# Patient Record
Sex: Male | Born: 1985 | Race: Black or African American | Hispanic: No | Marital: Single | Smoking: Current every day smoker
Health system: Southern US, Community
[De-identification: ages and names within clinical notes are randomized; demographics above are authoritative.]

## PROBLEM LIST (undated history)

## (undated) DIAGNOSIS — H16133 Photokeratitis, bilateral: Secondary | ICD-10-CM

## (undated) DIAGNOSIS — G43909 Migraine, unspecified, not intractable, without status migrainosus: Secondary | ICD-10-CM

## (undated) DIAGNOSIS — G8929 Other chronic pain: Secondary | ICD-10-CM

## (undated) DIAGNOSIS — M549 Dorsalgia, unspecified: Secondary | ICD-10-CM

---

## 2011-12-18 ENCOUNTER — Emergency Department (HOSPITAL_COMMUNITY): Payer: Self-pay

## 2011-12-18 ENCOUNTER — Emergency Department (HOSPITAL_COMMUNITY)
Admission: EM | Admit: 2011-12-18 | Discharge: 2011-12-19 | Disposition: A | Discharge status: Home / Self Care | Payer: Self-pay | Attending: Emergency Medicine | Admitting: Emergency Medicine

## 2011-12-18 ENCOUNTER — Encounter (HOSPITAL_COMMUNITY): Payer: Self-pay | Admitting: Emergency Medicine

## 2011-12-18 DIAGNOSIS — Z87828 Personal history of other (healed) physical injury and trauma: Secondary | ICD-10-CM | POA: Insufficient documentation

## 2011-12-18 DIAGNOSIS — M545 Low back pain, unspecified: Secondary | ICD-10-CM | POA: Insufficient documentation

## 2011-12-18 DIAGNOSIS — R51 Headache: Secondary | ICD-10-CM | POA: Insufficient documentation

## 2011-12-18 DIAGNOSIS — G8929 Other chronic pain: Secondary | ICD-10-CM | POA: Insufficient documentation

## 2011-12-18 DIAGNOSIS — M549 Dorsalgia, unspecified: Secondary | ICD-10-CM

## 2011-12-18 DIAGNOSIS — R159 Full incontinence of feces: Secondary | ICD-10-CM | POA: Insufficient documentation

## 2011-12-18 HISTORY — DX: Dorsalgia, unspecified: M54.9

## 2011-12-18 HISTORY — DX: Other chronic pain: G89.29

## 2011-12-18 HISTORY — DX: Migraine, unspecified, not intractable, without status migrainosus: G43.909

## 2011-12-18 MED ORDER — DIAZEPAM 5 MG PO TABS
5.0000 mg | ORAL_TABLET | Freq: Once | ORAL | Status: AC
  Administered 2011-12-19: 5 mg via ORAL
  Filled 2011-12-18: qty 1

## 2011-12-18 MED ORDER — HYDROCODONE-ACETAMINOPHEN 5-325 MG PO TABS
2.0000 | ORAL_TABLET | Freq: Once | ORAL | Status: AC
  Administered 2011-12-19: 2 via ORAL
  Filled 2011-12-18: qty 2

## 2011-12-18 MED ORDER — KETOROLAC TROMETHAMINE 10 MG PO TABS
10.0000 mg | ORAL_TABLET | Freq: Once | ORAL | Status: AC
  Administered 2011-12-19: 10 mg via ORAL
  Filled 2011-12-18: qty 1
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
## 2011-12-18 NOTE — ED Notes (Signed)
Pt c/o back and neck pain.  Denies any recent trauma or strain.  With shoulder movement, shooting pains down neck to back.

## 2011-12-19 MED ORDER — DICLOFENAC SODIUM 75 MG PO TBEC: 75.0000 mg | DELAYED_RELEASE_TABLET | Freq: Two times a day (BID) | ORAL | Status: AC

## 2011-12-19 MED ORDER — BACLOFEN 10 MG PO TABS: 10.0000 mg | ORAL_TABLET | Freq: Three times a day (TID) | ORAL | Status: AC

## 2011-12-19 NOTE — ED Notes (Signed)
Results of x-rays are still pending at this time. Patient informed.

## 2011-12-21 NOTE — ED Provider Notes (Signed)
Medical screening examination/treatment/procedure(s) were performed by non-physician practitioner and as supervising physician I was immediately available for consultation/collaboration.  Nicoletta Dress. Colon Branch, MD 12/21/11 1610

## 2014-09-04 DIAGNOSIS — H16133 Photokeratitis, bilateral: Secondary | ICD-10-CM

## 2014-09-04 HISTORY — DX: Photokeratitis, bilateral: H16.133

## 2014-09-23 ENCOUNTER — Emergency Department (HOSPITAL_COMMUNITY)
Admission: EM | Admit: 2014-09-23 | Discharge: 2014-09-23 | Disposition: A | Discharge status: Home / Self Care | Payer: Self-pay | Attending: Emergency Medicine | Admitting: Emergency Medicine

## 2014-09-23 ENCOUNTER — Encounter (HOSPITAL_COMMUNITY): Payer: Self-pay | Admitting: *Deleted

## 2014-09-23 DIAGNOSIS — H16133 Photokeratitis, bilateral: Secondary | ICD-10-CM | POA: Insufficient documentation

## 2014-09-23 DIAGNOSIS — G43909 Migraine, unspecified, not intractable, without status migrainosus: Secondary | ICD-10-CM | POA: Insufficient documentation

## 2014-09-23 DIAGNOSIS — G8929 Other chronic pain: Secondary | ICD-10-CM | POA: Insufficient documentation

## 2014-09-23 DIAGNOSIS — Z720 Tobacco use: Secondary | ICD-10-CM | POA: Insufficient documentation

## 2014-09-23 MED ORDER — KETOROLAC TROMETHAMINE 0.5 % OP SOLN
1.0000 [drp] | Freq: Four times a day (QID) | OPHTHALMIC | Status: DC | PRN
  Administered 2014-09-23: 1 [drp] via OPHTHALMIC
  Filled 2014-09-23: qty 5

## 2014-09-23 MED ORDER — FLUORESCEIN SODIUM 1 MG OP STRP
1.0000 | ORAL_STRIP | Freq: Once | OPHTHALMIC | Status: AC
  Administered 2014-09-23: 1 via OPHTHALMIC
  Filled 2014-09-23: qty 1

## 2014-09-23 MED ORDER — TOBRAMYCIN 0.3 % OP SOLN
1.0000 [drp] | OPHTHALMIC | Status: DC
  Administered 2014-09-23: 1 [drp] via OPHTHALMIC
  Filled 2014-09-23: qty 5

## 2014-09-23 MED ORDER — TETRACAINE HCL 0.5 % OP SOLN
1.0000 [drp] | Freq: Once | OPHTHALMIC | Status: AC
  Administered 2014-09-23: 1 [drp] via OPHTHALMIC
  Filled 2014-09-23: qty 2

## 2014-09-23 NOTE — Discharge Instructions (Signed)
Use the ocular (ketorolac) eye drops 1 drop in each eye 4 times a day for pain. Use the tobramax eye drops 1 drop in each eye every 4 hrs while awake for 1 day after your pain is gone. If you are not improving in the next 24 hrs, call Dr Geni Bers office to get an appointment to have him recheck your eyes. He is an Ophthalmologist.    Eye Drops Use eye drops as directed. It may be easier to have someone help you put the drops in your eye. If you are alone, use the following instructions to help you.  Wash your hands before putting drops in your eyes.  Read the label and look at your medication. Check for any expiration date that may appear on the bottle or tube. Changes of color may be a warning that the medication is old or ineffective. This is especially true if the medication has become brown in color. If you have questions or concerns, call your caregiver. DROPS  Tilt your head back with the affected eye uppermost. Gently pull down on your lower lid. Do not pull up on the upper lid.  Look up. Place the dropper or bottle just over the edge of the lower lid near the white portion at the bottom of the eye. The goal is to have the drop go into the little sac formed by the lower lid and the bottom of the eye itself. Do not release the drop from a height of several inches over the eye. That will only serve to startle the person receiving the medicine when it lands and forces a blink.  Steady your hand in a comfortable manner. An example would be to hold the dropper or bottle between your thumb and index (pointing) finger. Lean your index finger against the brow.  Then, slowly and gently squeeze one drop of medication into your eye.  Once the medication has been applied, place your finger between the lower eyelid and the nose, pressing firmly against the nose for 5-10 seconds. This will slow the process of the eye drop entering the small canal that normally drains tears into the nose, and therefore  increases the exposure of the medicine to the eye for a few extra seconds. OINTMENTS  Look up. Place the tip of the tube just over the edge of the lower lid near the white portion at the bottom of the eye. The goal is to create a line of ointment along the inner surface of the eyelid in the little sac formed by the lower lid and the bottom of the eye itself.  Avoid touching the tube tip to your eyeball or eyelid. This avoids contamination of the tube or the medicine in the tube.  Once a line of medicine has been created, hold the upper lid up and look down before releasing the upper lid. This will force the ointment to spread over the surface of the eye.  Your vision will be very blurry for a few minutes after applying an ointment properly. This is normal and will clear as you continue to blink. For this reason, it is best to apply ointments just before going to sleep, or at a time when you can rest your eyes for 5-10 minutes after applying the medication. GENERAL  Store your medicine in a cool, dry place after each use.  If you need a second medication, wait at least two minutes. This helps the first medication to be taken up (absorbed) by the eye.  If you have been instructed to use both an eye drop and an eye ointment, always apply the drop first and then the ointment 3-4 minutes afterward. Never put medications into the eye unless the label reads, "For Ophthalmic Use," "For Use In Eyes" or "Eye Drops." If you have questions, call your caregiver. Document Released: 03/28/2000 Document Revised: 05/06/2013 Document Reviewed: 06/03/2008 [facility] Patient Information 2015 [place], [place]. This information is not intended to replace advice given to you by your health care provider. Make sure you discuss any questions you have with your health care provider.  Ultraviolet Keratitis Ultraviolet keratitis can occur when too much UV light enters the cornea. The cornea is the clear cover on the front  part of your eye that helps focus light. It protects your eyes from dust and other foreign bodies and filters ultraviolet rays. This condition can be caused by exposure to snow (snow blindness) from the reflected or direct sunlight. It may also be caused by exposure to welding arcs or halogen lamps (flash burn) and prolonged exposure to direct sunlight. Brief exposure can result in severe problems several hours later. CAUSES  Causes of ultraviolet keratitis include:  Exposure to snow (snow blindness) from the reflected or direct sunlight.  Exposure to welding arcs or halogen lamps (flash burn).  Prolonged exposure to direct sunlight. SYMPTOMS  The symptoms of ultraviolet keratitis usually start 6 to 12 hours after the damage occurred. They may include the following:   Tearing.  Light sensitivity.  Gritty sensation in eyes.  Swelling of your eyelids.  Severe pain. In spite of the pain, this condition will usually improve within 24 hours even without treatment. HOME CARE INSTRUCTIONS   Apply cold packs on your eyes to ease the pain.  Only take over-the-counter or prescription medicines for pain, discomfort, or fever as directed by your caregiver.  Your caregiver may also prescribe an antibiotic ointment to help prevent infection and/or additional medications for pain relief.  Apply an eye patch to help relieve discomfort and assist in healing. If your caregiver patches your eyes, it is important to leave these patches on.  Follow the instructions given to you by your caregiver.  Do not rub your eyes.  If your caregiver has given you a follow-up appointment, it is very important to keep that appointment. Not keeping the appointment could result in a severe eye infection or permanent loss of vision. If there is any problem keeping the appointment, you must call back to this facility for assistance. SEEK IMMEDIATE MEDICAL CARE IF:   Pain is severe and not relieved by  medication.  Pain or problems with vision last more than 48 hours.  Exposure to light is unavoidable and you need extra protection for your eyes. MAKE SURE YOU:   Understand these instructions.  Will watch your condition.  Will get help right away if you are not doing well or get worse. Document Released: 12/20/2004 Document Revised: 05/06/2013 Document Reviewed: 07/27/2007 [facility] Patient Information 2015 [place], [place]. This information is not intended to replace advice given to you by your health care provider. Make sure you discuss any questions you have with your health care provider.
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
## 2014-09-23 NOTE — ED Notes (Signed)
Pt alert & oriented x4, stable gait. Patient given discharge instructions, paperwork & prescription(s). Patient  instructed to stop at the registration desk to finish any additional paperwork. Patient verbalized understanding. Pt left department w/ no further questions. 

## 2014-12-19 ENCOUNTER — Emergency Department (HOSPITAL_COMMUNITY)
Admission: EM | Admit: 2014-12-19 | Discharge: 2014-12-20 | Disposition: A | Discharge status: Home / Self Care | Payer: BLUE CROSS/BLUE SHIELD | Attending: Emergency Medicine | Admitting: Emergency Medicine

## 2014-12-19 ENCOUNTER — Encounter (HOSPITAL_COMMUNITY): Payer: Self-pay

## 2014-12-19 DIAGNOSIS — H5712 Ocular pain, left eye: Secondary | ICD-10-CM | POA: Diagnosis present

## 2014-12-19 DIAGNOSIS — H16133 Photokeratitis, bilateral: Secondary | ICD-10-CM | POA: Diagnosis not present

## 2014-12-19 DIAGNOSIS — G8929 Other chronic pain: Secondary | ICD-10-CM | POA: Diagnosis not present

## 2014-12-19 DIAGNOSIS — F17210 Nicotine dependence, cigarettes, uncomplicated: Secondary | ICD-10-CM | POA: Diagnosis not present

## 2014-12-19 DIAGNOSIS — Z8679 Personal history of other diseases of the circulatory system: Secondary | ICD-10-CM | POA: Diagnosis not present

## 2014-12-19 HISTORY — DX: Photokeratitis, bilateral: H16.133

## 2014-12-19 MED ORDER — HYDROCODONE-ACETAMINOPHEN 5-325 MG PO TABS: 1.0000 | ORAL_TABLET | ORAL | Status: DC | PRN

## 2014-12-19 MED ORDER — TOBRAMYCIN 0.3 % OP SOLN
2.0000 [drp] | Freq: Once | OPHTHALMIC | Status: AC
  Administered 2014-12-19: 2 [drp] via OPHTHALMIC
  Filled 2014-12-19: qty 5

## 2014-12-19 MED ORDER — IBUPROFEN 600 MG PO TABS: 600.0000 mg | ORAL_TABLET | Freq: Four times a day (QID) | ORAL | Status: DC | PRN

## 2014-12-19 MED ORDER — TETRACAINE HCL 0.5 % OP SOLN
2.0000 [drp] | Freq: Once | OPHTHALMIC | Status: AC
  Administered 2014-12-19: 2 [drp] via OPHTHALMIC
  Filled 2014-12-19: qty 4

## 2014-12-19 MED ORDER — IBUPROFEN 800 MG PO TABS
800.0000 mg | ORAL_TABLET | Freq: Once | ORAL | Status: AC
  Administered 2014-12-19: 800 mg via ORAL
  Filled 2014-12-19: qty 1

## 2014-12-19 MED ORDER — ONDANSETRON HCL 4 MG PO TABS
4.0000 mg | ORAL_TABLET | Freq: Once | ORAL | Status: AC
  Administered 2014-12-19: 4 mg via ORAL
  Filled 2014-12-19: qty 1

## 2014-12-19 MED ORDER — HOMATROPINE HBR 5 % OP SOLN
2.0000 [drp] | Freq: Once | OPHTHALMIC | Status: AC
  Administered 2014-12-20: 2 [drp] via OPHTHALMIC
  Filled 2014-12-19: qty 5

## 2014-12-19 MED ORDER — HYDROCODONE-ACETAMINOPHEN 5-325 MG PO TABS
2.0000 | ORAL_TABLET | Freq: Once | ORAL | Status: AC
  Administered 2014-12-19: 2 via ORAL
  Filled 2014-12-19: qty 2

## 2014-12-19 MED ORDER — FLUORESCEIN SODIUM 1 MG OP STRP
ORAL_STRIP | OPHTHALMIC | Status: AC
  Filled 2014-12-19: qty 1
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
## 2014-12-19 NOTE — ED Notes (Signed)
Pt unable to complete visual acuity test, unable to read letters on BP cuff.

## 2015-03-03 ENCOUNTER — Emergency Department (HOSPITAL_COMMUNITY)
Admission: EM | Admit: 2015-03-03 | Discharge: 2015-03-03 | Disposition: A | Discharge status: Home / Self Care | Payer: BLUE CROSS/BLUE SHIELD | Attending: Emergency Medicine | Admitting: Emergency Medicine

## 2015-03-03 ENCOUNTER — Encounter (HOSPITAL_COMMUNITY): Payer: Self-pay | Admitting: Emergency Medicine

## 2015-03-03 DIAGNOSIS — Z8679 Personal history of other diseases of the circulatory system: Secondary | ICD-10-CM | POA: Insufficient documentation

## 2015-03-03 DIAGNOSIS — G8929 Other chronic pain: Secondary | ICD-10-CM | POA: Diagnosis not present

## 2015-03-03 DIAGNOSIS — F17210 Nicotine dependence, cigarettes, uncomplicated: Secondary | ICD-10-CM | POA: Diagnosis not present

## 2015-03-03 DIAGNOSIS — H5713 Ocular pain, bilateral: Secondary | ICD-10-CM | POA: Diagnosis present

## 2015-03-03 DIAGNOSIS — H16133 Photokeratitis, bilateral: Secondary | ICD-10-CM | POA: Diagnosis not present

## 2015-03-03 MED ORDER — TROPICAMIDE 1 % OP SOLN: 1.0000 [drp] | Freq: Four times a day (QID) | OPHTHALMIC | Status: DC | PRN

## 2015-03-03 MED ORDER — TOBRAMYCIN 0.3 % OP SOLN
2.0000 [drp] | OPHTHALMIC | Status: DC
  Administered 2015-03-03: 2 [drp] via OPHTHALMIC
  Filled 2015-03-03: qty 5

## 2015-03-03 MED ORDER — FLUORESCEIN SODIUM 1 MG OP STRP
1.0000 | ORAL_STRIP | Freq: Once | OPHTHALMIC | Status: AC
  Administered 2015-03-03: 1 via OPHTHALMIC
  Filled 2015-03-03: qty 1

## 2015-03-03 MED ORDER — KETOROLAC TROMETHAMINE 0.5 % OP SOLN
1.0000 [drp] | Freq: Four times a day (QID) | OPHTHALMIC | Status: DC | PRN
  Administered 2015-03-03: 1 [drp] via OPHTHALMIC
  Filled 2015-03-03: qty 5

## 2015-03-03 MED ORDER — TETRACAINE HCL 0.5 % OP SOLN
2.0000 [drp] | Freq: Once | OPHTHALMIC | Status: AC
  Administered 2015-03-03: 2 [drp] via OPHTHALMIC
  Filled 2015-03-03: qty 4

## 2015-03-03 NOTE — ED Notes (Signed)
Patient verbalizes understanding of discharge instructions, home care and follow up care. Patient out of department at this time with family. 

## 2015-03-03 NOTE — ED Notes (Signed)
Pt c/o bilateral eye pain since welding.

## 2015-03-03 NOTE — ED Provider Notes (Signed)
CSN: [id number]     Arrival date & time 03/03/15  0109 History   First MD Initiated Contact with Patient 03/03/15 0147     Chief Complaint  Patient presents with  . 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 He states he woke up about 1 AM with pain in both his eyes. He also complains of a lot of tearing. He states his tetanus is up-to-date. He reports he's been to the ER several times before from [facility].  PCP none  Past Medical History  Diagnosis Date  . Chronic back pain   . Migraines   . 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 No pertinent past surgical history. History reviewed. No pertinent family history. Social History  Substance Use Topics  . Smoking status: Current Every Day Smoker    Types: Cigarettes  . Smokeless tobacco: None  . Alcohol Use: Yes  employed  Review of Systems  All other systems reviewed and are negative.     Allergies  Mushroom extract complex  Home Medications   Prior to Admission medications   Medication Sig Start Date End Date Taking? Authorizing Provider  HYDROcodone-acetaminophen (NORCO/VICODIN) 5-325 MG tablet Take 1 tablet by mouth every 4 (four) hours as needed. 12/19/14   Ivery Quale, PA-C  ibuprofen (ADVIL,MOTRIN) 600 MG tablet Take 1 tablet (600 mg total) by mouth every 6 (six) hours as needed. 12/19/14   Ivery Quale, PA-C   BP 129/89 mmHg  Pulse 99  Temp(Src) 98.1 F (36.7 C) (Oral)  Resp 20  Ht  (1.676 m)  Wt 180 lb (81.647 kg)  BMI 29.07 kg/m2  SpO2 98% Physical Exam  Constitutional: He is oriented to person, place, and time. He appears well-developed and well-nourished.  Non-toxic appearance. He does not appear ill. He appears distressed.  HENT:  Head: Normocephalic and atraumatic.  Right  Ear: External ear normal.  Left Ear: External ear normal.  Nose: Nose normal. No mucosal edema or rhinorrhea.  Mouth/Throat: Oropharynx is clear and moist and mucous membranes are normal. No dental abscesses or uvula swelling.  Eyes: Conjunctivae and EOM are normal. Pupils are equal, round, and reactive to light.  Patient is noted to have a lot of clear tearing from both eyes. Both conjunctiva are injected. Tetracaine and fluorescent stain was placed in both eyes. Onset lip exam he has multiple punctate uptake of the cornea bilaterally without ulcer or foreign body.  Neck: Normal range of motion and full passive range of motion without pain.  Pulmonary/Chest: Effort normal. No respiratory distress. He has no rhonchi. He exhibits no crepitus.  Abdominal: Normal appearance.  Musculoskeletal: Normal range of motion.  Moves all extremities well.   Neurological: He is alert and oriented to person, place, and time. He has normal strength. No cranial nerve deficit.  Skin: Skin is warm, dry and intact. No rash noted. No erythema. No pallor.  Psychiatric: He has a normal mood and affect. His speech is normal and behavior is normal. His mood appears not anxious.  Nursing note and vitals reviewed.   ED Course  Procedures (including critical care time)  Medications  ketorolac (ACULAR) 0.5 % ophthalmic solution 1 drop (not administered)  tobramycin (TOBREX) 0.3 % ophthalmic solution 2 drop (not administered)  tropicamide (MYDRIACYL) 1 % ophthalmic solution 1 drop (not  administered)  tetracaine (PONTOCAINE) 0.5 % ophthalmic solution 2 drop (2 drops Both Eyes Given 03/03/15 0142)  fluorescein ophthalmic strip 1 strip (1 strip Both Eyes Given 03/03/15 0142)    Patient states last time he was here about 2 days later he describes what sounds like iritis with pain on going out into the bright lights. He was given Mydriacyl to use if that happens again. He was started on ketorolac eyedrops for pain, tobramycin  eye drops to prevent infection. Most of these heal rapidly.  MDM   Final diagnoses:  Welders' keratitis of both eyes     Plan discharge  Devoria Albe, MD, Concha Pyo, MD 03/03/15 [phone]

## 2015-03-03 NOTE — Discharge Instructions (Signed)
Use the ketorolac eyedrops in both eyes 4 times a day as needed for pain, use the Tobrex eyedrops in both eyes every 4 hours while awake. If you should develop pain when you walk into the daylight or in a room that is bright start the Mydriacyl eyedrops 4 times a day as needed. If your eye is not improving rapidly in the next 24-36 hours you should see Dr. Lita Mains, he has an ophthalmologist in [place]. Call his office for an appointment. 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    How to Use Eye Drops and Eye Ointments HOW TO APPLY EYE DROPS Follow these steps when applying eye drops: 1. Wash your hands. 2. Tilt your head back. 3. Put a finger under your eye and use it to gently pull your lower lid downward. Keep that finger in place. 4. Using your other hand, hold the dropper between your thumb and index finger. 5. Position the dropper just over the edge of the lower lid. Hold it as close to your eye as you can without touching the dropper to your eye. 6. Steady your hand. One way to do this is to lean your index finger against your brow. 7. Look up. 8. Slowly and gently squeeze one drop of medicine into your eye. 9. Close your eye. 10. Place a finger between your lower eyelid and your nose. Press gently for 2 minutes. This increases the amount of time that the medicine is exposed to the eye. It also reduces side effects that can develop if the drop gets into the bloodstream through the nose. HOW TO APPLY EYE OINTMENTS Follow these steps when applying eye ointments: 1. Wash your hands. 2. Put a finger under your eye and use it to gently pull your lower lid downward. Keep that finger in place. 3. Using your other hand, place the tip of the tube between your thumb and index finger with the remaining fingers braced against your cheek or nose. 4. Hold the tube just over the edge of your lower lid without touching the tube to your lid or eyeball. 5. Look up. 6. Line the inner part  of your lower lid with ointment. 7. Gently pull up on your upper lid and look down. This will force the ointment to spread over the surface of the eye. 8. Release the upper lid. 9. If you can, close your eyes for 1-2 minutes. Do not rub your eyes. If you applied the ointment correctly, your vision will be blurry for a few minutes. This is normal. ADDITIONAL INFORMATION  Make sure to use the eye drops or ointment as told by your health care provider.  If you have been told to use both eye drops and an eye ointment, apply the eye drops first, then wait 3-4 minutes before you apply the ointment.  Try not to touch the tip of the dropper or tube to your eye. A dropper or tube that has touched the eye can become contaminated.   This information is not intended to replace advice given to you by your health care provider. Make sure you discuss any questions you have with your health care provider.   Document Released: 03/28/2000 Document Revised: 05/06/2014 Document Reviewed: 12/16/2013 Elsevier Interactive Patient Education [employer]

## 2015-05-12 ENCOUNTER — Emergency Department (HOSPITAL_COMMUNITY)
Admission: EM | Admit: 2015-05-12 | Discharge: 2015-05-12 | Disposition: A | Discharge status: Home / Self Care | Payer: BLUE CROSS/BLUE SHIELD | Attending: Emergency Medicine | Admitting: Emergency Medicine

## 2015-05-12 ENCOUNTER — Emergency Department (HOSPITAL_COMMUNITY): Payer: BLUE CROSS/BLUE SHIELD

## 2015-05-12 ENCOUNTER — Encounter (HOSPITAL_COMMUNITY): Payer: Self-pay | Admitting: Emergency Medicine

## 2015-05-12 DIAGNOSIS — Z79899 Other long term (current) drug therapy: Secondary | ICD-10-CM | POA: Diagnosis not present

## 2015-05-12 DIAGNOSIS — F17210 Nicotine dependence, cigarettes, uncomplicated: Secondary | ICD-10-CM | POA: Insufficient documentation

## 2015-05-12 DIAGNOSIS — K529 Noninfective gastroenteritis and colitis, unspecified: Secondary | ICD-10-CM | POA: Insufficient documentation

## 2015-05-12 DIAGNOSIS — R109 Unspecified abdominal pain: Secondary | ICD-10-CM | POA: Diagnosis present

## 2015-05-12 LAB — URINALYSIS, ROUTINE W REFLEX MICROSCOPIC
Bilirubin Urine: NEGATIVE
Glucose, UA: NEGATIVE mg/dL
Hgb urine dipstick: NEGATIVE
Ketones, ur: NEGATIVE mg/dL
Leukocytes, UA: NEGATIVE
Nitrite: NEGATIVE
Specific Gravity, Urine: 1.02 (ref 1.005–1.030)
pH: 6 (ref 5.0–8.0)

## 2015-05-12 LAB — COMPREHENSIVE METABOLIC PANEL
ALBUMIN: 4.4 g/dL (ref 3.5–5.0)
ALT: 63 U/L (ref 17–63)
AST: 39 U/L (ref 15–41)
Alkaline Phosphatase: 59 U/L (ref 38–126)
Anion gap: 8 (ref 5–15)
BUN: 8 mg/dL (ref 6–20)
CHLORIDE: 106 mmol/L (ref 101–111)
CO2: 29 mmol/L (ref 22–32)
Calcium: 9.6 mg/dL (ref 8.9–10.3)
Creatinine, Ser: 0.98 mg/dL (ref 0.61–1.24)
GFR calc Af Amer: >60 mL/min (ref >60)
GFR calc non Af Amer: >60 mL/min (ref >60)
GLUCOSE: 96 mg/dL (ref 65–99)
POTASSIUM: 4.2 mmol/L (ref 3.5–5.1)
Sodium: 143 mmol/L (ref 135–145)
Total Bilirubin: 0.7 mg/dL (ref 0.3–1.2)
Total Protein: 8 g/dL (ref 6.5–8.1)

## 2015-05-12 LAB — CBC WITH DIFFERENTIAL/PLATELET
Basophils Absolute: 0 10*3/uL (ref 0.0–0.1)
Basophils Relative: 1 %
Eosinophils Absolute: 0.3 10*3/uL (ref 0.0–0.7)
Eosinophils Relative: 5 %
HCT: 48.3 % (ref 39.0–52.0)
Hemoglobin: 16.2 g/dL (ref 13.0–17.0)
Lymphocytes Relative: 35 %
Lymphs Abs: 2.1 10*3/uL (ref 0.7–4.0)
MCH: 31.5 pg (ref 26.0–34.0)
MCHC: 33.5 g/dL (ref 30.0–36.0)
MCV: 94 fL (ref 78.0–100.0)
Monocytes Absolute: 0.7 10*3/uL (ref 0.1–1.0)
Monocytes Relative: 11 %
Neutro Abs: 3.1 10*3/uL (ref 1.7–7.7)
Neutrophils Relative %: 50 %
Platelets: 244 10*3/uL (ref 150–400)
RBC: 5.14 MIL/uL (ref 4.22–5.81)
RDW: 12.3 % (ref 11.5–15.5)
WBC: 6.2 10*3/uL (ref 4.0–10.5)

## 2015-05-12 LAB — LIPASE, BLOOD: Lipase: 17 U/L (ref 11–51)

## 2015-05-12 LAB — URINE MICROSCOPIC-ADD ON
Bacteria, UA: NONE SEEN
RBC / HPF: NONE SEEN RBC/hpf (ref 0–5)
Squamous Epithelial / LPF: NONE SEEN
WBC UA: NONE SEEN WBC/hpf (ref 0–5)

## 2015-05-12 MED ORDER — IOPAMIDOL (ISOVUE-300) INJECTION 61%
100.0000 mL | Freq: Once | INTRAVENOUS | Status: AC | PRN
  Administered 2015-05-12: 100 mL via INTRAVENOUS

## 2015-05-12 MED ORDER — SODIUM CHLORIDE 0.9 % IV BOLUS (SEPSIS)
500.0000 mL | Freq: Once | INTRAVENOUS | Status: AC
  Administered 2015-05-12: 500 mL via INTRAVENOUS

## 2015-05-12 MED ORDER — PREDNISONE 50 MG PO TABS: ORAL_TABLET | ORAL | Status: DC

## 2015-05-12 NOTE — ED Notes (Signed)
C/o abdominal cramping for last 2 months.  Rates pain 9/10.  Having brown stools but sees blood when wiping. Denies any n/v/d.

## 2015-05-12 NOTE — ED Provider Notes (Signed)
CSN: [id number]     Arrival date & time 05/12/15  [id number] History  By signing my name below, I, Paul Moyer, attest that this documentation has been prepared under the direction and in the presence of Donnetta HutchingBrian Teegan Guinther, MD. Electronically Signed: Placido SouLogan Moyer, ED Scribe. 05/12/2015. 10:03 AM.   Chief Complaint  Patient presents with  . Abdominal Cramping   The history is provided by the patient. No language interpreter was used.    HPI Comments: Paul Moyer is a 30 y.o. male who presents to the Emergency Department complaining of waxing and waning, mild, cramping, abd pain x 2 months. He reports associated, moderate, diarrhea averaging 6 x per day which he describes as brown/clear/watery with occasional traces of blood and mucous. He additionally reports recent, mild, weight gain. Pt states that his cramping pain presents s/p eating. He reports a PMHx of migraines but denies a PMHx of DM. Pt confirms regular ETOH use and a hx of smoking. Pt works as a Psychologist, occupationalwelder. Pt denies change in appetite or any other associated symptoms at this time.   Past Medical History  Diagnosis Date  . Chronic back pain   . Migraines   . 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 No pertinent past surgical history. History reviewed. No pertinent family history. Social History  Substance Use Topics  . Smoking status: Current Every Day Smoker    Types: Cigarettes  . Smokeless tobacco: None  . Alcohol Use: Yes    Review of Systems  Constitutional: Positive for unexpected weight change. Negative for appetite change.  Gastrointestinal: Positive for abdominal pain and diarrhea. Negative for constipation.  All other systems reviewed and are negative.   Allergies  Mushroom extract complex  Home Medications   Prior to Admission medications   Medication Sig Start Date End Date Taking? Authorizing Provider  ketorolac (ACULAR) 0.5 % ophthalmic solution Place 2 drops into both eyes daily as needed  (for burning pain in the eye).   Yes Historical Provider, MD  tobramycin (TOBREX) 0.3 % ophthalmic solution Place 1 drop into both eyes daily as needed (for burning pain in the eyes).   Yes Historical Provider, MD  HYDROcodone-acetaminophen (NORCO/VICODIN) 5-325 MG tablet Take 1 tablet by mouth every 4 (four) hours as needed. Patient not taking: Reported on 05/12/2015 12/19/14   Ivery QualeHobson Bryant, PA-C  ibuprofen (ADVIL,MOTRIN) 600 MG tablet Take 1 tablet (600 mg total) by mouth every 6 (six) hours as needed. Patient not taking: Reported on 05/12/2015 12/19/14   Ivery QualeHobson Bryant, PA-C  predniSONE (DELTASONE) 50 MG tablet 1 tablet for 6 days, one half tablet for 6 days 05/12/15   Donnetta HutchingBrian Chalee Hirota, MD   BP 126/95 mmHg  Pulse 69  Temp(Src) 98.1 F (36.7 C) (Oral)  Resp 17  Ht 5\' 6"  (1.676 m)  Wt 185 lb (83.915 kg)  BMI 29.87 kg/m2  SpO2 97%    Physical Exam  Constitutional: He is oriented to person, place, and time. He appears well-developed and well-nourished.  HENT:  Head: Normocephalic and atraumatic.  Eyes: Conjunctivae and EOM are normal. Pupils are equal, round, and reactive to light.  Neck: Normal range of motion. Neck supple.  Cardiovascular: Normal rate and regular rhythm.   Pulmonary/Chest: Effort normal and breath sounds normal.  Abdominal: Soft. Bowel sounds are normal. There is no tenderness.  abd non tender  Musculoskeletal: Normal range of motion.  Neurological: He is alert and oriented to person, place, and time.  Skin: Skin is warm  and dry.  Psychiatric: He has a normal mood and affect. His behavior is normal.  Nursing note and vitals reviewed.   ED Course  Procedures  DIAGNOSTIC STUDIES: Oxygen Saturation is 100% on RA, normal by my interpretation.    COORDINATION OF CARE: 9:59 AM Discussed next steps with pt including a CT abd/pelvis and labs. He verbalized understanding and is agreeable with the plan.   Labs Review Labs Reviewed  URINALYSIS, ROUTINE W REFLEX MICROSCOPIC  (NOT AT [facility]) - Abnormal; Notable for the following:    Protein, ur TRACE (*)    All other components within normal limits  COMPREHENSIVE METABOLIC PANEL  CBC WITH DIFFERENTIAL/PLATELET  LIPASE, BLOOD  URINE MICROSCOPIC-ADD ON    Imaging Review Ct Abdomen Pelvis W Contrast  05/12/2015  CLINICAL DATA:  Mild cramping and abdominal pain for the past 2 months. Diarrhea. Recent mild weight gain. EXAM: CT ABDOMEN AND PELVIS WITH CONTRAST TECHNIQUE: Multidetector CT imaging of the abdomen and pelvis was performed using the standard protocol following bolus administration of intravenous contrast. CONTRAST:  ISOVUE-300 IOPAMIDOL (ISOVUE-300) INJECTION 61% COMPARISON:  Lumbar spine radiographs dated 12/19/2011. FINDINGS: Lower chest:  Clear lung bases. Hepatobiliary: Normal appearing liver and gallbladder. Pancreas: No mass, inflammatory changes, or other significant abnormality. Spleen: Within normal limits in size and appearance. Adrenals/Urinary Tract: Normal appearing adrenal glands, kidneys, ureters and urinary bladder. No urinary tract calculi or hydronephrosis. Stomach/Bowel: Diffuse wall thickening involving the duodenum and multiple loops of jejunum. Normal appearing stomach, distal small bowel and colon. Normal appearing appendix. Vascular/Lymphatic: No pathologically enlarged lymph nodes. No evidence of abdominal aortic aneurysm. Reproductive: No mass or other significant abnormality. Other: Tiny umbilical hernia containing fat. Musculoskeletal:  Small left iliac bone island. IMPRESSION: 1. Diffuse duodenal and proximal jejunal wall thickening. This is most commonly seen with gastroenteritis. Inflammatory bowel disease is also a possibility, among other, more rare causes. 2. Otherwise, unremarkable examination. Electronically Signed   By: Beckie Salts M.D.   On: 05/12/2015 11:54   I have personally reviewed and evaluated these images and lab results as part of my medical decision-making.   EKG  Interpretation None      MDM   Final diagnoses:  Enteritis    No acute abdomen. CT scan shows diffuse duodenal and jejunal wall thickening. Since symptoms have persisted for 2 months, will prescribe prednisone. Follow-up with gastroenterology.  I personally performed the services described in this documentation, which was scribed in my presence. The recorded information has been reviewed and is accurate.     Donnetta Hutching, MD 05/12/15 1349

## 2015-05-12 NOTE — Discharge Instructions (Signed)
You have some inflammation in your bowel. Prescription for prednisone which should help this. Follow-up with a gastroenterologist. Phone number given.

## 2015-09-24 ENCOUNTER — Encounter (HOSPITAL_COMMUNITY): Payer: Self-pay | Admitting: Cardiology

## 2015-09-24 ENCOUNTER — Emergency Department (HOSPITAL_COMMUNITY)
Admission: EM | Admit: 2015-09-24 | Discharge: 2015-09-24 | Disposition: A | Discharge status: Home / Self Care | Payer: BLUE CROSS/BLUE SHIELD | Attending: Emergency Medicine | Admitting: Emergency Medicine

## 2015-09-24 DIAGNOSIS — H578 Other specified disorders of eye and adnexa: Secondary | ICD-10-CM | POA: Diagnosis not present

## 2015-09-24 DIAGNOSIS — Y929 Unspecified place or not applicable: Secondary | ICD-10-CM | POA: Insufficient documentation

## 2015-09-24 DIAGNOSIS — F17210 Nicotine dependence, cigarettes, uncomplicated: Secondary | ICD-10-CM | POA: Diagnosis not present

## 2015-09-24 DIAGNOSIS — Y990 Civilian activity done for income or pay: Secondary | ICD-10-CM | POA: Insufficient documentation

## 2015-09-24 DIAGNOSIS — H5789 Other specified disorders of eye and adnexa: Secondary | ICD-10-CM

## 2015-09-24 DIAGNOSIS — Y939 Activity, unspecified: Secondary | ICD-10-CM | POA: Diagnosis not present

## 2015-09-24 DIAGNOSIS — W890XXA Exposure to welding light (arc), initial encounter: Secondary | ICD-10-CM | POA: Diagnosis not present

## 2015-09-24 DIAGNOSIS — H5713 Ocular pain, bilateral: Secondary | ICD-10-CM | POA: Diagnosis present

## 2015-09-24 DIAGNOSIS — Z79899 Other long term (current) drug therapy: Secondary | ICD-10-CM | POA: Insufficient documentation

## 2015-09-24 MED ORDER — KETOROLAC TROMETHAMINE 0.5 % OP SOLN: 1.0000 [drp] | Freq: Four times a day (QID) | OPHTHALMIC | 1 refills | Status: AC

## 2015-09-24 MED ORDER — TOBRAMYCIN 0.3 % OP SOLN
2.0000 [drp] | Freq: Once | OPHTHALMIC | Status: AC
  Administered 2015-09-24: 2 [drp] via OPHTHALMIC
  Filled 2015-09-24: qty 5

## 2015-09-24 NOTE — ED Provider Notes (Signed)
[facility]-EMERGENCY DEPT Provider Note   CSN: [id number] Arrival date & time: 09/24/15  1123     History   Chief Complaint Chief Complaint  Patient presents with  . Eye Injury    HPI Paul Moyer is a 30 y.o. male.  Patient is a 30 year old male who presents to the emergency department complaining of eye pain.  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 The patient denies that the person was actually welding at the time. There was no flying metal or problems with projectile's at the time of the irritation.    Eye Injury  Pertinent negatives include no chest pain, no abdominal pain and no shortness of breath.    Past Medical History:  Diagnosis Date  . Chronic back pain   . Migraines   . 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   History reviewed. No pertinent surgical history.     Home Medications    Prior to Admission medications   Medication Sig Start Date End Date Taking? Authorizing Provider  tobramycin (TOBREX) 0.3 % ophthalmic solution Place 1 drop into both eyes daily as needed (for burning pain in the eyes).   Yes Historical Provider, MD  ketorolac (ACULAR) 0.5 % ophthalmic solution Place 1 drop into both eyes every 6 (six) hours. 09/24/15   Ivery Quale, PA-C    Family History History reviewed. No pertinent family history.  Social History Social History  Substance Use Topics  . Smoking status: Current Every Day Smoker    Types: Cigarettes  . Smokeless tobacco: Not on file  . Alcohol use Yes     Allergies   Mushroom extract complex   Review of Systems Review of Systems  Constitutional: Negative for activity change.       All ROS Neg except as noted in HPI  HENT: Negative for nosebleeds.   Eyes:  Positive for photophobia and redness. Negative for discharge.  Respiratory: Negative for cough, shortness of breath and wheezing.   Cardiovascular: Negative for chest pain and palpitations.  Gastrointestinal: Negative for abdominal pain and blood in stool.  Genitourinary: Negative for dysuria, frequency and hematuria.  Musculoskeletal: Negative for arthralgias, back pain and neck pain.  Skin: Negative.   Neurological: Negative for dizziness, seizures and speech difficulty.  Psychiatric/Behavioral: Negative for confusion and hallucinations.  All other systems reviewed and are negative.    Physical Exam Updated Vital Signs BP 133/79 (BP Location: Left Arm)   Pulse 101   Temp 98.6 F (37 C) (Oral)   Resp 18   Ht 5\' 6"  (1.676 m)   Wt 81.6 kg   SpO2 99%   BMI 29.05 kg/m   Physical Exam  Eyes: EOM and lids are normal. Pupils are equal, round, and reactive to light. Right eye exhibits no hordeolum. No foreign body present in the right eye. Left eye exhibits no hordeolum. No foreign body present in the left eye. Right conjunctiva is injected. Left conjunctiva is injected.  Fundoscopic exam:      The right eye shows no AV nicking, no hemorrhage and no papilledema.       The left eye shows no AV nicking, no hemorrhage and no papilledema.  Slit lamp exam:  The right eye shows no corneal ulcer, no foreign body and no hyphema.       The left eye shows no corneal ulcer, no foreign body and no hyphema.     ED Treatments / Results  Labs (all labs ordered are listed, but only abnormal results are displayed) Labs Reviewed - No data to display  EKG  EKG Interpretation None       Radiology No results found.  Procedures Procedures (including critical care time)  Medications Ordered in ED Medications  tobramycin (TOBREX) 0.3 % ophthalmic solution 2 drop (not administered)     Initial Impression / Assessment and Plan / ED Course  I have reviewed the triage vital signs and  the nursing notes.  Pertinent labs & imaging results that were available during my care of the patient were reviewed by me and considered in my medical decision making (see chart for details).  Clinical Course    **I have reviewed nursing notes, vital signs, and all appropriate lab and imaging results for this patient.*  Final Clinical Impressions(s) / ED Diagnoses  The examination favors Welders injury to the eye. There is no evidence of any foreign body. There is no evidence of any corneal ulcers. The patient will be treated with Acular air and tobramycin ophthalmic drops. I've asked him to see Dr. Bing PlumeHaynes the ophthalmologist if not improving. The patient is in agreement with this discharge plan.    Final diagnoses:  Eye irritation    New Prescriptions New Prescriptions   KETOROLAC (ACULAR) 0.5 % OPHTHALMIC SOLUTION    Place 1 drop into both eyes every 6 (six) hours.     Ivery QualeHobson Judea Riches, PA-C 09/24/15 1320    Shaune Pollackameron Isaacs, MD 09/25/15 2110

## 2015-09-24 NOTE — Discharge Instructions (Signed)
Your vital signs within normal limits. Your examination suggest irritation, probably from your well-being equipment. Please use 2 drops of tobramycin ophthalmic solution every 4 hours. Please use 2 drops of Accular in your eyes 3 times daily.

## 2015-09-24 NOTE — ED Triage Notes (Signed)
Burn to eyes from welding this morning.

## 2015-09-24 NOTE — ED Notes (Signed)
PA at bedside.

## 2015-09-24 NOTE — ED Notes (Signed)
Patient came to door and stated "Damn, have I been forgot about". Informed patient the provider will be in with him as soon as he can.

## 2015-12-18 ENCOUNTER — Emergency Department (HOSPITAL_COMMUNITY)
Admission: EM | Admit: 2015-12-18 | Discharge: 2015-12-18 | Disposition: A | Discharge status: Home / Self Care | Payer: BLUE CROSS/BLUE SHIELD | Attending: Emergency Medicine | Admitting: Emergency Medicine

## 2015-12-18 ENCOUNTER — Encounter (HOSPITAL_COMMUNITY): Payer: Self-pay | Admitting: Emergency Medicine

## 2015-12-18 DIAGNOSIS — H168 Other keratitis: Secondary | ICD-10-CM | POA: Insufficient documentation

## 2015-12-18 DIAGNOSIS — Z79899 Other long term (current) drug therapy: Secondary | ICD-10-CM | POA: Diagnosis not present

## 2015-12-18 DIAGNOSIS — H578 Other specified disorders of eye and adnexa: Secondary | ICD-10-CM | POA: Diagnosis present

## 2015-12-18 DIAGNOSIS — F17210 Nicotine dependence, cigarettes, uncomplicated: Secondary | ICD-10-CM | POA: Diagnosis not present

## 2015-12-18 DIAGNOSIS — H16131 Photokeratitis, right eye: Secondary | ICD-10-CM

## 2015-12-18 MED ORDER — TETRACAINE HCL 0.5 % OP SOLN
2.0000 [drp] | Freq: Once | OPHTHALMIC | Status: DC
  Filled 2015-12-18: qty 4

## 2015-12-18 MED ORDER — FLUORESCEIN SODIUM 0.6 MG OP STRP
2.0000 | ORAL_STRIP | Freq: Once | OPHTHALMIC | Status: DC
  Filled 2015-12-18: qty 2

## 2015-12-18 MED ORDER — TOBRAMYCIN 0.3 % OP SOLN
1.0000 [drp] | Freq: Once | OPHTHALMIC | Status: AC
  Administered 2015-12-18: 1 [drp] via OPHTHALMIC
  Filled 2015-12-18: qty 5

## 2015-12-18 NOTE — Discharge Instructions (Signed)
Apply one drop of the tobramycin to the right eye every 4 hrs and one drop of your acular 4 times a day.  Follow-up with the eye doctor listed. 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

## 2015-12-18 NOTE — ED Notes (Signed)
Medications and lamp at bedside.  

## 2015-12-18 NOTE — ED Triage Notes (Signed)
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  Pt says he takes eye medication as needed flash burn from welding.  Pt says he is out of his medication and need refill.
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
## 2017-05-27 IMAGING — CT CT ABD-PELV W/ CM
2 of 4 series · 16 of 46 positions shown, 18 images · IV contrast (Omnipaque 300)
Comparison: Lumbar spine radiographs dated 12/19/2011.

CLINICAL DATA: Mild cramping and abdominal pain for the past 2
months. Diarrhea. Recent mild weight gain.

EXAM:
CT ABDOMEN AND PELVIS WITH CONTRAST
TECHNIQUE: Multidetector CT imaging of the abdomen and pelvis was performed
using the standard protocol following bolus administration of
intravenous contrast.
CONTRAST:  100mL 6S5ORK-TCC IOPAMIDOL (6S5ORK-TCC) INJECTION 61%

[Series 2: abd_pel_with 5.0 b40f · axial · 0.69mm/px · z∈[-421,-51]mm · 13 of 82 slices shown, 15 images]
[im 4/82  soft-tissue]
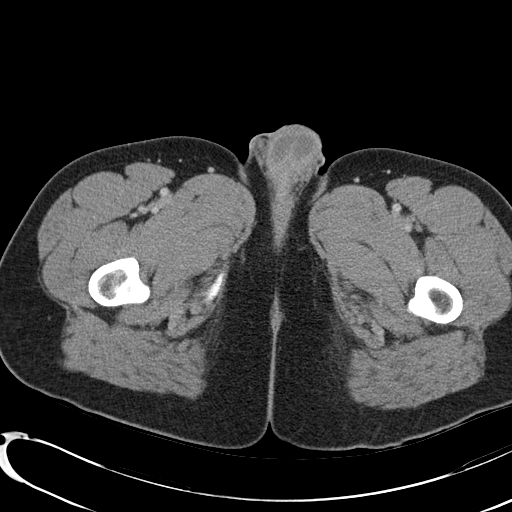
[im 4/82  bone]
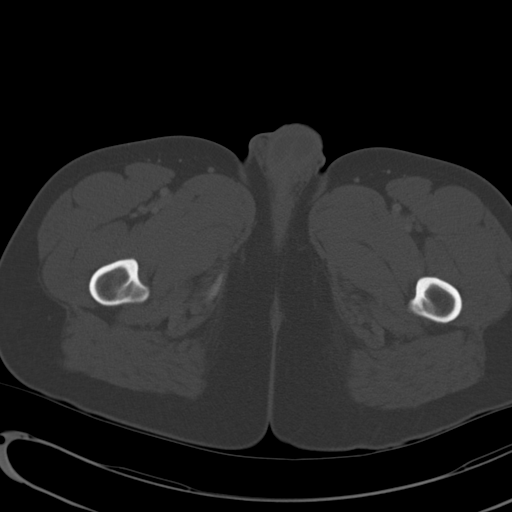
[im 10/82  soft-tissue]
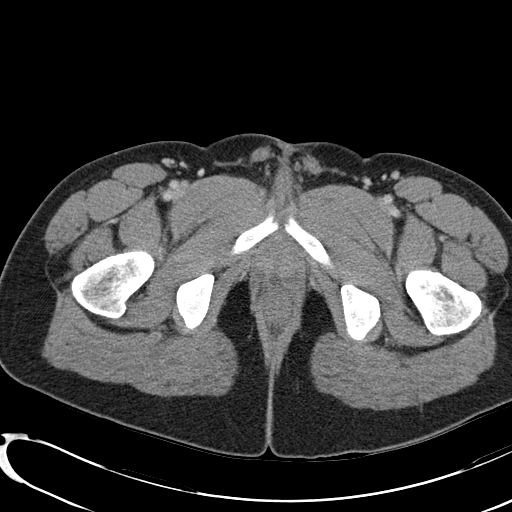
[im 17/82  soft-tissue]
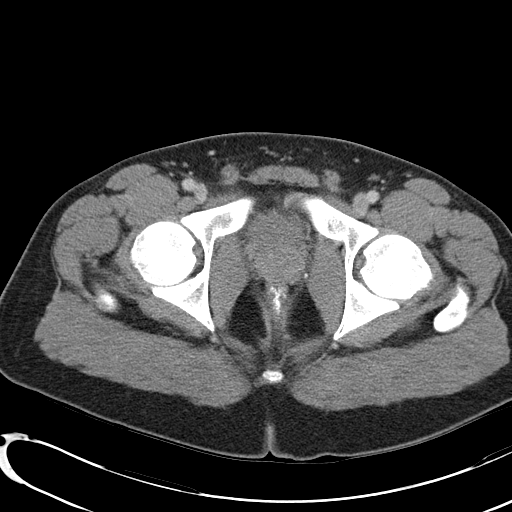
[im 23/82  soft-tissue]
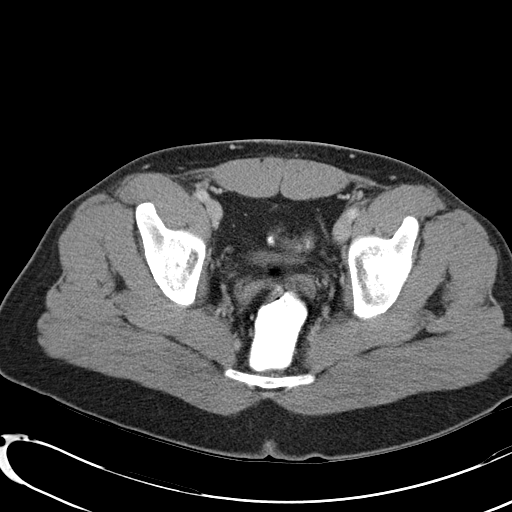
[im 30/82  soft-tissue]
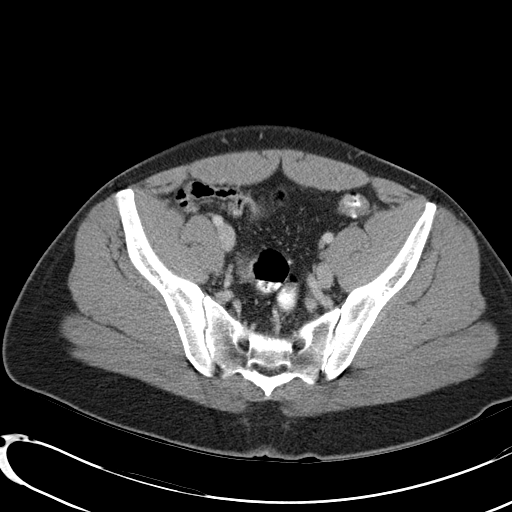
[im 36/82  soft-tissue]
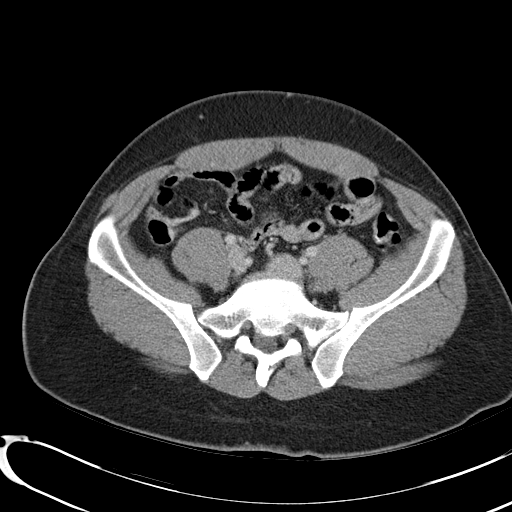
[im 43/82  soft-tissue]
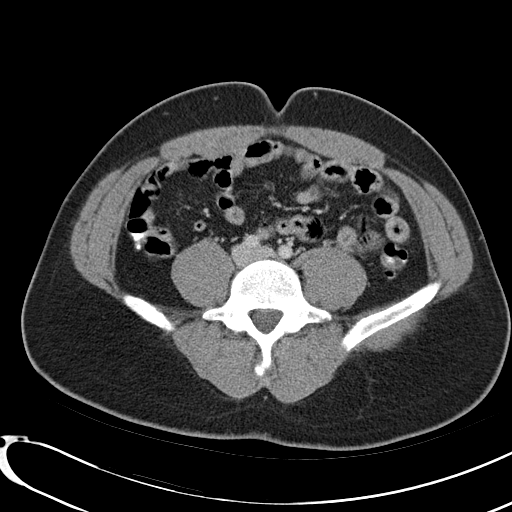
[im 46/82  soft-tissue]
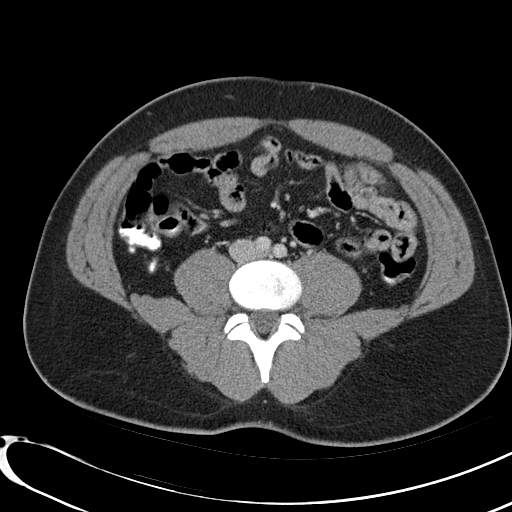
[im 52/82  soft-tissue]
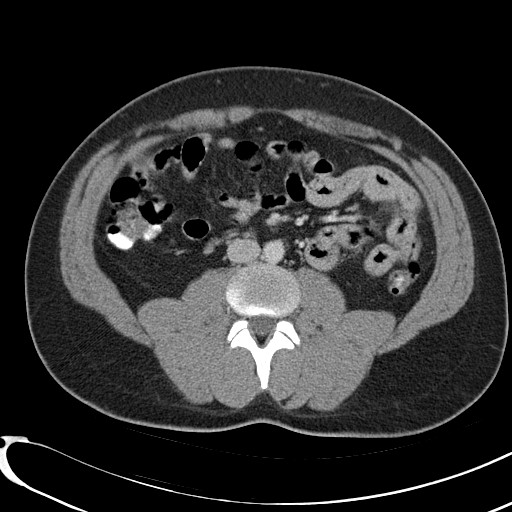
[im 52/82  bone]
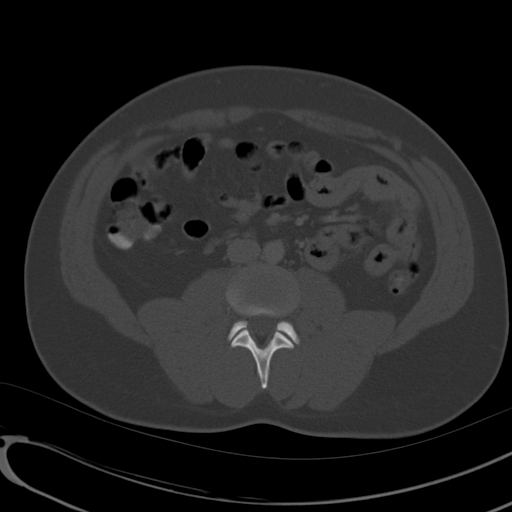
[im 59/82  soft-tissue]
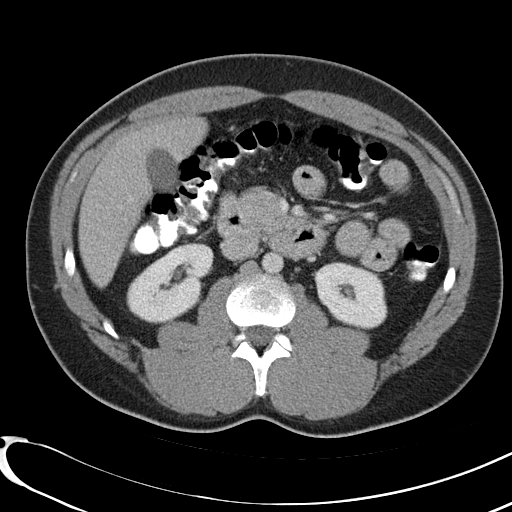
[im 65/82  soft-tissue]
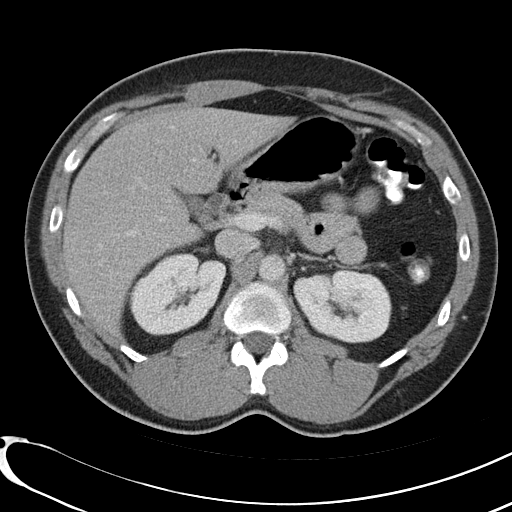
[im 72/82  soft-tissue]
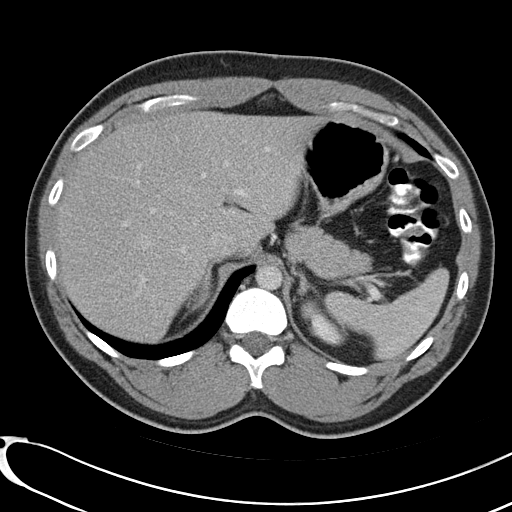
[im 78/82  soft-tissue]
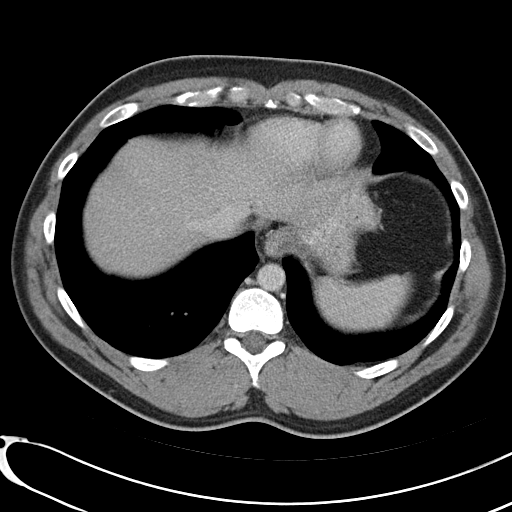

[Series 4: abd_pel_with 3.0 spo cor · coronal · 0.68mm/px · 3 of 90 slices shown]
[im 30/90  soft-tissue]
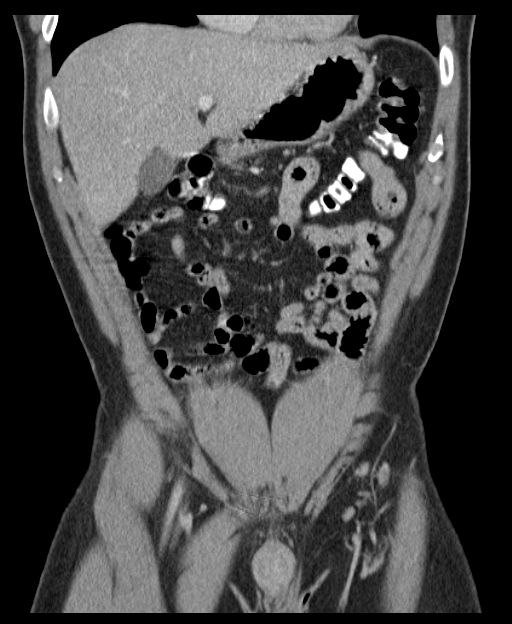
[im 40/90  soft-tissue]
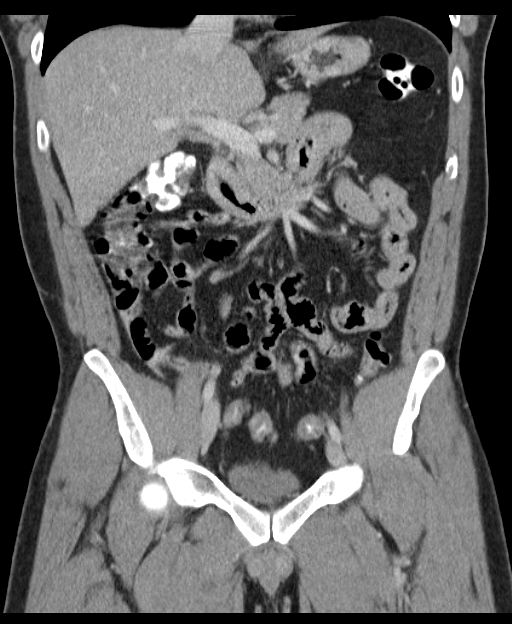
[im 50/90  soft-tissue]
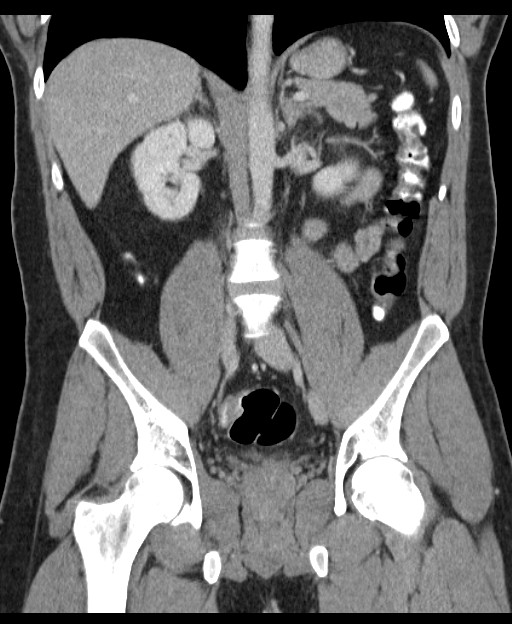

[16 of 46 positions shown; findings below may reference images not displayed]

FINDINGS: Lower chest:  Clear lung bases.

Hepatobiliary: Normal appearing liver and gallbladder.

Pancreas: No mass, inflammatory changes, or other significant
abnormality.

Spleen: Within normal limits in size and appearance.

Adrenals/Urinary Tract: Normal appearing adrenal glands, kidneys,
ureters and urinary bladder. No urinary tract calculi or
hydronephrosis.

Stomach/Bowel: Diffuse wall thickening involving the duodenum and
multiple loops of jejunum. Normal appearing stomach, distal small
bowel and colon. Normal appearing appendix.

Vascular/Lymphatic: No pathologically enlarged lymph nodes. No
evidence of abdominal aortic aneurysm.

Reproductive: No mass or other significant abnormality.

Other: Tiny umbilical hernia containing fat.

Musculoskeletal:  Small left iliac bone island.
IMPRESSION: 1. Diffuse duodenal and proximal jejunal wall thickening. This is
most commonly seen with gastroenteritis. Inflammatory bowel disease
is also a possibility, among other, more rare causes.
2. Otherwise, unremarkable examination.

## 2021-11-27 ENCOUNTER — Other Ambulatory Visit: Payer: Self-pay

## 2021-11-27 ENCOUNTER — Emergency Department (HOSPITAL_COMMUNITY)
Admission: EM | Admit: 2021-11-27 | Discharge: 2021-11-28 | Disposition: A | Discharge status: Home / Self Care | Payer: BLUE CROSS/BLUE SHIELD | Attending: Emergency Medicine | Admitting: Emergency Medicine

## 2021-11-27 ENCOUNTER — Emergency Department (HOSPITAL_COMMUNITY): Payer: BLUE CROSS/BLUE SHIELD

## 2021-11-27 DIAGNOSIS — S0181XA Laceration without foreign body of other part of head, initial encounter: Secondary | ICD-10-CM | POA: Insufficient documentation

## 2021-11-27 DIAGNOSIS — Y92410 Unspecified street and highway as the place of occurrence of the external cause: Secondary | ICD-10-CM | POA: Insufficient documentation

## 2021-11-27 DIAGNOSIS — M25552 Pain in left hip: Secondary | ICD-10-CM | POA: Insufficient documentation

## 2021-11-27 DIAGNOSIS — F10920 Alcohol use, unspecified with intoxication, uncomplicated: Secondary | ICD-10-CM

## 2021-11-27 DIAGNOSIS — Z23 Encounter for immunization: Secondary | ICD-10-CM | POA: Insufficient documentation

## 2021-11-27 DIAGNOSIS — W2201XA Walked into wall, initial encounter: Secondary | ICD-10-CM | POA: Insufficient documentation

## 2021-11-27 DIAGNOSIS — F10129 Alcohol abuse with intoxication, unspecified: Secondary | ICD-10-CM | POA: Insufficient documentation

## 2021-11-27 MED ORDER — TETANUS-DIPHTH-ACELL PERTUSSIS 5-2.5-18.5 LF-MCG/0.5 IM SUSY
0.5000 mL | PREFILLED_SYRINGE | Freq: Once | INTRAMUSCULAR | Status: AC
  Administered 2021-11-28: 0.5 mL via INTRAMUSCULAR
  Filled 2021-11-27: qty 0.5
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
## 2021-11-28 LAB — RAPID URINE DRUG SCREEN, HOSP PERFORMED
Amphetamines: NOT DETECTED
Barbiturates: NOT DETECTED
Benzodiazepines: NOT DETECTED
Cocaine: NOT DETECTED
Opiates: NOT DETECTED
Tetrahydrocannabinol: POSITIVE — AB

## 2021-11-28 LAB — ETHANOL: Alcohol, Ethyl (B): 174 mg/dL — ABNORMAL HIGH (ref <10)
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
